# Patient Record
Sex: Male | Born: 1990
Health system: Southern US, Community
[De-identification: ages and names within clinical notes are randomized; demographics above are authoritative.]

## PROBLEM LIST (undated history)

## (undated) DIAGNOSIS — F1490 Cocaine use, unspecified, uncomplicated: Secondary | ICD-10-CM

---
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
## 2017-07-11 ENCOUNTER — Emergency Department (HOSPITAL_BASED_OUTPATIENT_CLINIC_OR_DEPARTMENT_OTHER): Payer: No Typology Code available for payment source

## 2017-07-11 ENCOUNTER — Emergency Department (HOSPITAL_BASED_OUTPATIENT_CLINIC_OR_DEPARTMENT_OTHER)
Admission: EM | Admit: 2017-07-11 | Discharge: 2017-07-11 | Disposition: A | Discharge status: Home / Self Care | Payer: No Typology Code available for payment source | Attending: Emergency Medicine | Admitting: Emergency Medicine

## 2017-07-11 ENCOUNTER — Encounter (HOSPITAL_BASED_OUTPATIENT_CLINIC_OR_DEPARTMENT_OTHER): Payer: Self-pay | Admitting: Emergency Medicine

## 2017-07-11 ENCOUNTER — Other Ambulatory Visit: Payer: Self-pay

## 2017-07-11 DIAGNOSIS — K0381 Cracked tooth: Secondary | ICD-10-CM | POA: Diagnosis not present

## 2017-07-11 DIAGNOSIS — T148XXA Other injury of unspecified body region, initial encounter: Secondary | ICD-10-CM | POA: Insufficient documentation

## 2017-07-11 DIAGNOSIS — Y92410 Unspecified street and highway as the place of occurrence of the external cause: Secondary | ICD-10-CM | POA: Insufficient documentation

## 2017-07-11 DIAGNOSIS — R51 Headache: Secondary | ICD-10-CM | POA: Insufficient documentation

## 2017-07-11 DIAGNOSIS — Y998 Other external cause status: Secondary | ICD-10-CM | POA: Diagnosis not present

## 2017-07-11 DIAGNOSIS — F17200 Nicotine dependence, unspecified, uncomplicated: Secondary | ICD-10-CM | POA: Diagnosis not present

## 2017-07-11 DIAGNOSIS — S3992XA Unspecified injury of lower back, initial encounter: Secondary | ICD-10-CM | POA: Diagnosis present

## 2017-07-11 DIAGNOSIS — S29012A Strain of muscle and tendon of back wall of thorax, initial encounter: Secondary | ICD-10-CM | POA: Diagnosis not present

## 2017-07-11 DIAGNOSIS — Y9389 Activity, other specified: Secondary | ICD-10-CM | POA: Diagnosis not present

## 2017-07-11 LAB — URINALYSIS, ROUTINE W REFLEX MICROSCOPIC
Bilirubin Urine: NEGATIVE
GLUCOSE, UA: NEGATIVE mg/dL
Hgb urine dipstick: NEGATIVE
Ketones, ur: NEGATIVE mg/dL
LEUKOCYTES UA: NEGATIVE
Nitrite: NEGATIVE
PROTEIN: NEGATIVE mg/dL
SPECIFIC GRAVITY, URINE: 1.015 (ref 1.005–1.030)
pH: 6.5 (ref 5.0–8.0)

## 2017-07-11 MED ORDER — HYDROCODONE-ACETAMINOPHEN 5-325 MG PO TABS
2.0000 | ORAL_TABLET | Freq: Once | ORAL | Status: AC
  Administered 2017-07-11: 2 via ORAL
  Filled 2017-07-11: qty 2

## 2017-07-11 MED ORDER — CYCLOBENZAPRINE HCL 10 MG PO TABS: 10.0000 mg | ORAL_TABLET | Freq: Two times a day (BID) | ORAL | 0 refills | Status: DC | PRN

## 2017-07-11 MED FILL — CYCLOBENZAPRINE 10 MG TABLE: 10 | 10 days supply | Qty: 20 | Fill #0
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
## 2017-07-11 NOTE — ED Notes (Signed)
Asking how he is supposed to get his medication "Do I have to pay for them" Patient asking about the muscle relaxant and getting it filled elsewhere. The patient made aware that he needs to discuss this with the pharmacy

## 2017-07-11 NOTE — ED Notes (Signed)
Patient ambulating to the restroom and back without any noted distress. He states that the pain is not any better " I can walk better and the Headache is better but my back still hurts the same" Patient then standing at the bedside showing where his pain is and moving without grimacing. Is smiling and getting into bed without any noted grimacing or distress . U/A collected and sent to the Lab

## 2017-07-11 NOTE — ED Notes (Signed)
Patient is pacing around in the room, and talking on phone. Awaiting for the patient to complete his phone call prior to doing the d/c instructions
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
## 2017-07-11 NOTE — ED Notes (Signed)
Went into give patient pain medications as requested. The patient is on the phone with a lawyer. Took multiple minutes for patient to stop talking on phone and take pain medications.

## 2017-07-11 NOTE — ED Notes (Signed)
Patient transported to X-ray and CT 

## 2017-11-25 ENCOUNTER — Other Ambulatory Visit: Payer: Self-pay

## 2017-11-25 ENCOUNTER — Encounter (HOSPITAL_BASED_OUTPATIENT_CLINIC_OR_DEPARTMENT_OTHER): Payer: Self-pay | Admitting: Emergency Medicine

## 2017-11-25 ENCOUNTER — Emergency Department (HOSPITAL_BASED_OUTPATIENT_CLINIC_OR_DEPARTMENT_OTHER)
Admission: EM | Admit: 2017-11-25 | Discharge: 2017-11-25 | Disposition: A | Discharge status: Home / Self Care | Payer: Self-pay | Attending: Emergency Medicine | Admitting: Emergency Medicine

## 2017-11-25 DIAGNOSIS — K047 Periapical abscess without sinus: Secondary | ICD-10-CM | POA: Insufficient documentation

## 2017-11-25 DIAGNOSIS — R519 Headache, unspecified: Secondary | ICD-10-CM

## 2017-11-25 DIAGNOSIS — R51 Headache: Secondary | ICD-10-CM | POA: Insufficient documentation

## 2017-11-25 DIAGNOSIS — F17210 Nicotine dependence, cigarettes, uncomplicated: Secondary | ICD-10-CM | POA: Insufficient documentation

## 2017-11-25 MED ORDER — AMOXICILLIN 500 MG PO CAPS
500.0000 mg | ORAL_CAPSULE | Freq: Once | ORAL | Status: AC
  Administered 2017-11-25: 500 mg via ORAL
  Filled 2017-11-25: qty 1

## 2017-11-25 MED ORDER — AMOXICILLIN 500 MG PO CAPS: 500.0000 mg | ORAL_CAPSULE | Freq: Three times a day (TID) | ORAL | 0 refills | Status: DC

## 2017-11-25 MED ORDER — TRAMADOL HCL 50 MG PO TABS
50.0000 mg | ORAL_TABLET | Freq: Once | ORAL | Status: AC
  Administered 2017-11-25: 50 mg via ORAL
  Filled 2017-11-25: qty 1

## 2017-11-25 MED ORDER — TRAMADOL HCL 50 MG PO TABS: 50.0000 mg | ORAL_TABLET | Freq: Four times a day (QID) | ORAL | 0 refills | Status: DC | PRN

## 2017-11-25 NOTE — Discharge Instructions (Signed)
It was our pleasure to provide your ER care today - we hope that you feel better.  Take amoxicillin (antibiotic) as prescribed.  Take acetaminophen as need for pain. You may also take ultram as need for pain - no driving when taking.  Follow up with dentist in the coming week - call office Monday to arrange appointment.  Return to ER if worse, other concern.

## 2017-11-25 NOTE — ED Triage Notes (Signed)
Patient reports that he was here last night for his daughter. The patient states that he is having pain to his right ear and tooth

## 2017-11-25 NOTE — ED Provider Notes (Signed)
[facility] EMERGENCY DEPARTMENT Provider Note   CSN: [id number] Arrival date & time: 11/25/17  1636     History   Chief Complaint Chief Complaint  Patient presents with  . Otalgia    HPI James Moody is a 27 y.o. male.  Patient c/o right upper tooth pain, and now shooting pain towards right ear and cheek. States previously had a cracked tooth in area. Denies acute/new injury to area. No fever or chills. No sore throat. No trouble breathing or swallowing. No change in hearing. No headache. No neck swelling.pain.   The history is provided by the patient.  Otalgia  Pertinent negatives include no headaches, no sore throat and no vomiting.    History reviewed. No pertinent past medical history.  There are no active problems to display for this patient.   History reviewed. No pertinent surgical history.      Home Medications    Prior to Admission medications   Medication Sig Start Date End Date Taking? Authorizing Provider  cyclobenzaprine (FLEXERIL) 10 MG tablet Take 1 tablet (10 mg total) by mouth 2 (two) times daily as needed for muscle spasms. 07/11/17   Maxwell Caul, PA-C    Family History History reviewed. No pertinent family history.  Social History Social History   Tobacco Use  . Smoking status: Current Every Day Smoker  . Smokeless tobacco: Never Used  Substance Use Topics  . Alcohol use: No    Frequency: Never  . Drug use: Yes    Types: Marijuana     Allergies   Motrin [ibuprofen]   Review of Systems Review of Systems  Constitutional: Negative for fever.  HENT: Positive for dental problem and ear pain. Negative for sore throat and trouble swallowing.   Respiratory: Negative for shortness of breath.   Gastrointestinal: Negative for vomiting.  Neurological: Negative for headaches.     Physical Exam Updated Vital Signs BP (!) 157/74 (BP Location: Left Arm)   Pulse 97   Temp 98.2 F (36.8 C) (Oral)   Resp 18   Ht 1.803  m (5\' 11" )   Wt 106.6 kg (235 lb)   SpO2 100%   BMI 32.78 kg/m   Physical Exam  Constitutional: He appears well-developed and well-nourished. No distress.  HENT:  Mouth/Throat: Oropharynx is clear and moist.  Right eac and tm normal. No mastoid tenderness. Minimal swelling right cheek. Right upper dental decay/partially broken tooth noted, mild gum swelling to posterior molar. No swelling to pharynx, floor of mouth or neck.   Eyes: Conjunctivae are normal.  Neck: Neck supple. No tracheal deviation present.  Cardiovascular: Normal rate.  Pulmonary/Chest: Effort normal. No accessory muscle usage. No respiratory distress.  Lymphadenopathy:    He has no cervical adenopathy.  Neurological: He is alert.  Skin: Skin is warm and dry. No rash noted. He is not diaphoretic.  Psychiatric: He has a normal mood and affect.  Nursing note and vitals reviewed.    ED Treatments / Results  Labs (all labs ordered are listed, but only abnormal results are displayed) Labs Reviewed - No data to display  EKG None  Radiology No results found.  Procedures Procedures (including critical care time)  Medications Ordered in ED Medications  amoxicillin (AMOXIL) capsule 500 mg (has no administration in time range)  traMADol (ULTRAM) tablet 50 mg (has no administration in time range)     Initial Impression / Assessment and Plan / ED Course  I have reviewed the triage vital signs and the  nursing notes.  Pertinent labs & imaging results that were available during my care of the patient were reviewed by me and considered in my medical decision making (see chart for details).  Confirmed nkda, and w motrin has gi upset.   No meds pta. Pt has ride.   Ultram po.  Amoxicillin po.  rec close outpt dental f/u.     Final Clinical Impressions(s) / ED Diagnoses   Final diagnoses:  None    ED Discharge Orders    None       Cathren LaineSteinl, Dezirae Service, MD 11/25/17 1736

## 2018-11-21 ENCOUNTER — Emergency Department (HOSPITAL_BASED_OUTPATIENT_CLINIC_OR_DEPARTMENT_OTHER)
Admission: EM | Admit: 2018-11-21 | Discharge: 2018-11-21 | Disposition: A | Discharge status: Home / Self Care | Payer: Self-pay | Attending: Emergency Medicine | Admitting: Emergency Medicine

## 2018-11-21 ENCOUNTER — Encounter (HOSPITAL_BASED_OUTPATIENT_CLINIC_OR_DEPARTMENT_OTHER): Payer: Self-pay | Admitting: Emergency Medicine

## 2018-11-21 ENCOUNTER — Other Ambulatory Visit: Payer: Self-pay

## 2018-11-21 DIAGNOSIS — T754XXA Electrocution, initial encounter: Secondary | ICD-10-CM | POA: Insufficient documentation

## 2018-11-21 DIAGNOSIS — F17200 Nicotine dependence, unspecified, uncomplicated: Secondary | ICD-10-CM | POA: Insufficient documentation

## 2018-11-21 DIAGNOSIS — F1410 Cocaine abuse, uncomplicated: Secondary | ICD-10-CM | POA: Insufficient documentation

## 2018-11-21 HISTORY — DX: Cocaine use, unspecified, uncomplicated: F14.90

## 2018-11-21 MED ORDER — LORAZEPAM 1 MG PO TABS
1.0000 mg | ORAL_TABLET | Freq: Once | ORAL | Status: AC
  Administered 2018-11-21: 1 mg via ORAL
  Filled 2018-11-21: qty 1

## 2018-11-21 NOTE — ED Notes (Signed)
Pt given water per request
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
## 2018-11-21 NOTE — ED Notes (Signed)
Pt admits to using cocaine 1 hour PTA.
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
## 2019-03-24 ENCOUNTER — Emergency Department (HOSPITAL_BASED_OUTPATIENT_CLINIC_OR_DEPARTMENT_OTHER): Payer: Self-pay

## 2019-03-24 ENCOUNTER — Encounter (HOSPITAL_BASED_OUTPATIENT_CLINIC_OR_DEPARTMENT_OTHER): Payer: Self-pay | Admitting: Emergency Medicine

## 2019-03-24 ENCOUNTER — Other Ambulatory Visit: Payer: Self-pay

## 2019-03-24 ENCOUNTER — Emergency Department (HOSPITAL_BASED_OUTPATIENT_CLINIC_OR_DEPARTMENT_OTHER)
Admission: EM | Admit: 2019-03-24 | Discharge: 2019-03-24 | Disposition: A | Discharge status: Home / Self Care | Payer: Self-pay | Attending: Emergency Medicine | Admitting: Emergency Medicine

## 2019-03-24 DIAGNOSIS — R0789 Other chest pain: Secondary | ICD-10-CM | POA: Insufficient documentation

## 2019-03-24 DIAGNOSIS — F17200 Nicotine dependence, unspecified, uncomplicated: Secondary | ICD-10-CM | POA: Insufficient documentation

## 2019-03-24 MED ORDER — LABETALOL HCL 5 MG/ML IV SOLN
20.0000 mg | Freq: Once | INTRAVENOUS | Status: AC
  Administered 2019-03-24: 01:00:00 20 mg via INTRAVENOUS

## 2019-03-24 MED ORDER — LABETALOL HCL 5 MG/ML IV SOLN
INTRAVENOUS | Status: AC
  Filled 2019-03-24: qty 4

## 2019-03-24 MED ORDER — SUCRALFATE 1 GM/10ML PO SUSP
1.0000 g | Freq: Once | ORAL | Status: AC
  Administered 2019-03-24: 02:00:00 1 g via ORAL
  Filled 2019-03-24: qty 10

## 2019-03-24 MED ORDER — PANTOPRAZOLE SODIUM 40 MG IV SOLR
40.0000 mg | Freq: Once | INTRAVENOUS | Status: AC
  Administered 2019-03-24: 40 mg via INTRAVENOUS
  Filled 2019-03-24: qty 40

## 2019-03-24 MED ORDER — ACETAMINOPHEN 500 MG PO TABS
1000.0000 mg | ORAL_TABLET | Freq: Once | ORAL | Status: AC
  Administered 2019-03-24: 01:00:00 1000 mg via ORAL

## 2019-03-24 MED ORDER — ACETAMINOPHEN 500 MG PO TABS
ORAL_TABLET | ORAL | Status: AC
  Filled 2019-03-24: qty 2

## 2019-03-24 NOTE — ED Provider Notes (Signed)
[facility] Provider Note: Georgena Spurling, MD, FACEP  CSN: [id number] MRN: [id number] ARRIVAL: 03/24/19 at [place]: [place]  Chest Pain   HISTORY OF PRESENT ILLNESS  03/24/19 12:46 AM James Moody is a 28 y.o. male who developed the sudden onset of chest pain about 15 minutes prior to arrival while he was lying down.  The pain is well localized and located to the left of his lower sternum.  The pain is worse when leaning forward or moving and better when lying supine and still.  He is equivocal about associated shortness of breath but states he has difficulty taking a deep breath when he is bending forward.  He denies heavy lifting or other inciting activity.  He rates his pain as a 7 out of 10 and describes it as a tightness, burning or dull pain.  He states he last used cocaine 2 days ago (snorted).   Past Medical History:  Diagnosis Date  . Cocaine use     History reviewed. No pertinent surgical history.  No family history on file.  Social History   Tobacco Use  . Smoking status: Current Every Day Smoker  . Smokeless tobacco: Never Used  Substance Use Topics  . Alcohol use: Yes    Frequency: Never  . Drug use: Yes    Types: Marijuana, Cocaine    Comment: snort cocaine last use 2 days ago    Prior to Admission medications   Not on File    Allergies Motrin [ibuprofen]   REVIEW OF SYSTEMS  Negative except as noted here or in the History of Present Illness.   PHYSICAL EXAMINATION  Initial Vital Signs Blood pressure (!) 157/98, pulse (!) 118, temperature 98.1 F (36.7 C), temperature source Oral, resp. rate 20, height 5\' 11"  (1.803 m), weight 106.6 kg, SpO2 100 %.  Examination General: Well-developed, well-nourished male in no acute distress; appearance consistent with age of record HENT: normocephalic; atraumatic Eyes: pupils equal, round and reactive to light; extraocular muscles intact Neck: supple Heart: regular rate  and rhythm; tachycardia Lungs: clear to auscultation bilaterally Chest: Nontender but pain reproduced with movement of chest Abdomen: soft; nondistended; nontender; bowel sounds present Extremities: No deformity; full range of motion; pulses normal Neurologic: Awake, alert and oriented; motor function intact in all extremities and symmetric; no facial droop Skin: Warm and dry Psychiatric: Flat affect; expresses fear of being electrocuted by his EKG leads   RESULTS  Summary of this visit's results, reviewed by myself:   EKG Interpretation  Date/Time:  Sunday March 24 2019 00:40:33 EDT Ventricular Rate:  115 PR Interval:    QRS Duration: 94 QT Interval:  347 QTC Calculation: 480 R Axis:   68 Text Interpretation:  Sinus tachycardia Abnormal T, consider ischemia, diffuse leads Baseline wander in lead(s) V4 No significant change was found Confirmed by Shanon Rosser [phone]) on 03/24/2019 12:51:27 AM       EKG Interpretation  Date/Time:  Sunday March 24 2019 02:47:48 EDT Ventricular Rate:  89 PR Interval:    QRS Duration: 105 QT Interval:  385 QTC Calculation: 469 R Axis:   67 Text Interpretation:  Sinus rhythm Nonspecific T abnormalities, diffuse leads Borderline prolonged QT interval Rate is slower Confirmed by Shanon Rosser [phone]) on 03/24/2019 2:49:59 AM      Laboratory Studies: No results found for this or any previous visit (from the past 24 hour(s)). Imaging Studies: Dg Chest 2 View  Result Date: 03/24/2019 CLINICAL DATA:  Localize left lower chest pain. Acute onset of shortness of breath. EXAM: CHEST - 2 VIEW COMPARISON:  07/11/2017 FINDINGS: The cardiomediastinal contours are normal. The lungs are clear. Pulmonary vasculature is normal. No consolidation, pleural effusion, or pneumothorax. No acute osseous abnormalities are seen. IMPRESSION: Unremarkable radiographs of the chest. Electronically Signed   By: Narda RutherfordMelanie  Sanford M.D.   On: 03/24/2019 01:23    ED COURSE and  MDM  Nursing notes and initial vitals signs, including pulse oximetry, reviewed.  Vitals:   03/24/19 0113 03/24/19 0137 03/24/19 0138 03/24/19 0200  BP:  (!) 162/104  (!) 153/84  Pulse: (!) 123  94 99  Resp: 17   (!) 25  Temp:      TempSrc:      SpO2: 100%  100% 100%  Weight:      Height:       2:45 AM Tachycardia improved with labetalol given for suspected cocaine use.  Patient still complaining of burning in his chest.  He was given acetaminophen, IV Protonix and oral Carafate.  Toradol avoided due to his documented ibuprofen allergy.   2:58 AM Patient pain-free now.  The nature of his pain is not consistent with acute coronary syndrome.  It is more likely chest wall pain.  He has no risk factors for pulmonary embolism.   PROCEDURES    ED DIAGNOSES     ICD-10-CM   1. Chest wall pain  R07.89        Paula LibraMolpus, Bell Cai, MD 03/24/19 0300

## 2019-03-24 NOTE — ED Notes (Signed)
Provided warm blanket. HR improved however continues to be hypertensive and c/o pain. Reports no relief from pain, continues to complain of burning. States "I feel hot from the inside out." EDP aware of patient's complaints.
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
## 2019-03-24 NOTE — ED Triage Notes (Signed)
Pt c/o of left sided chest tightness and shortness of breath that started approx 15 mins pta. States he was just laying down when it started.
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
## 2019-03-24 NOTE — ED Notes (Signed)
Pt reports no pain at this time. EDP notified.

## 2020-11-30 IMAGING — CR CHEST - 2 VIEW
2 series · 2 of 2 positions shown · non-contrast
Comparison: 07/11/2017

CLINICAL DATA: Localize left lower chest pain. Acute onset of
shortness of breath.

EXAM:
CHEST - 2 VIEW

[w chest pa]
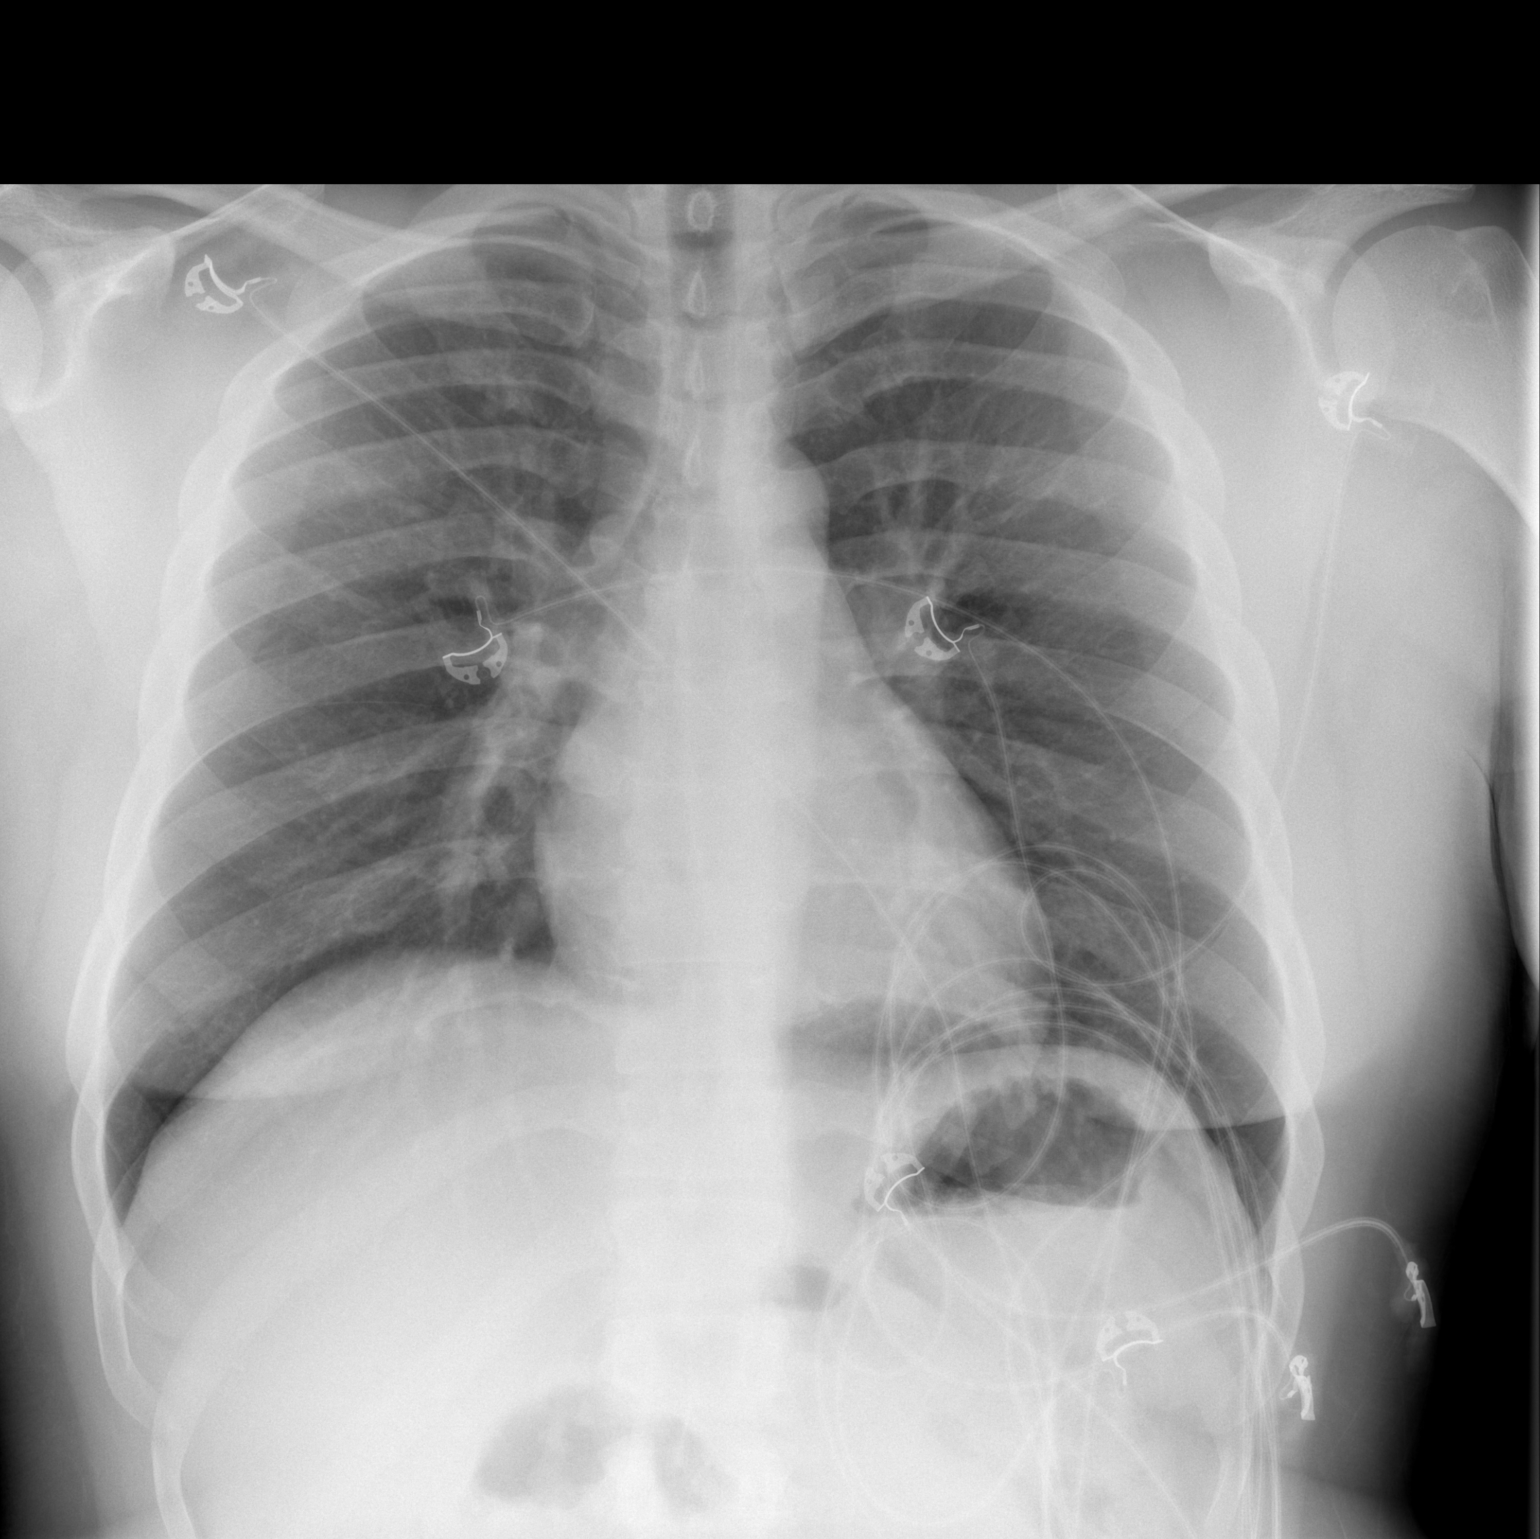

[w chest lat]
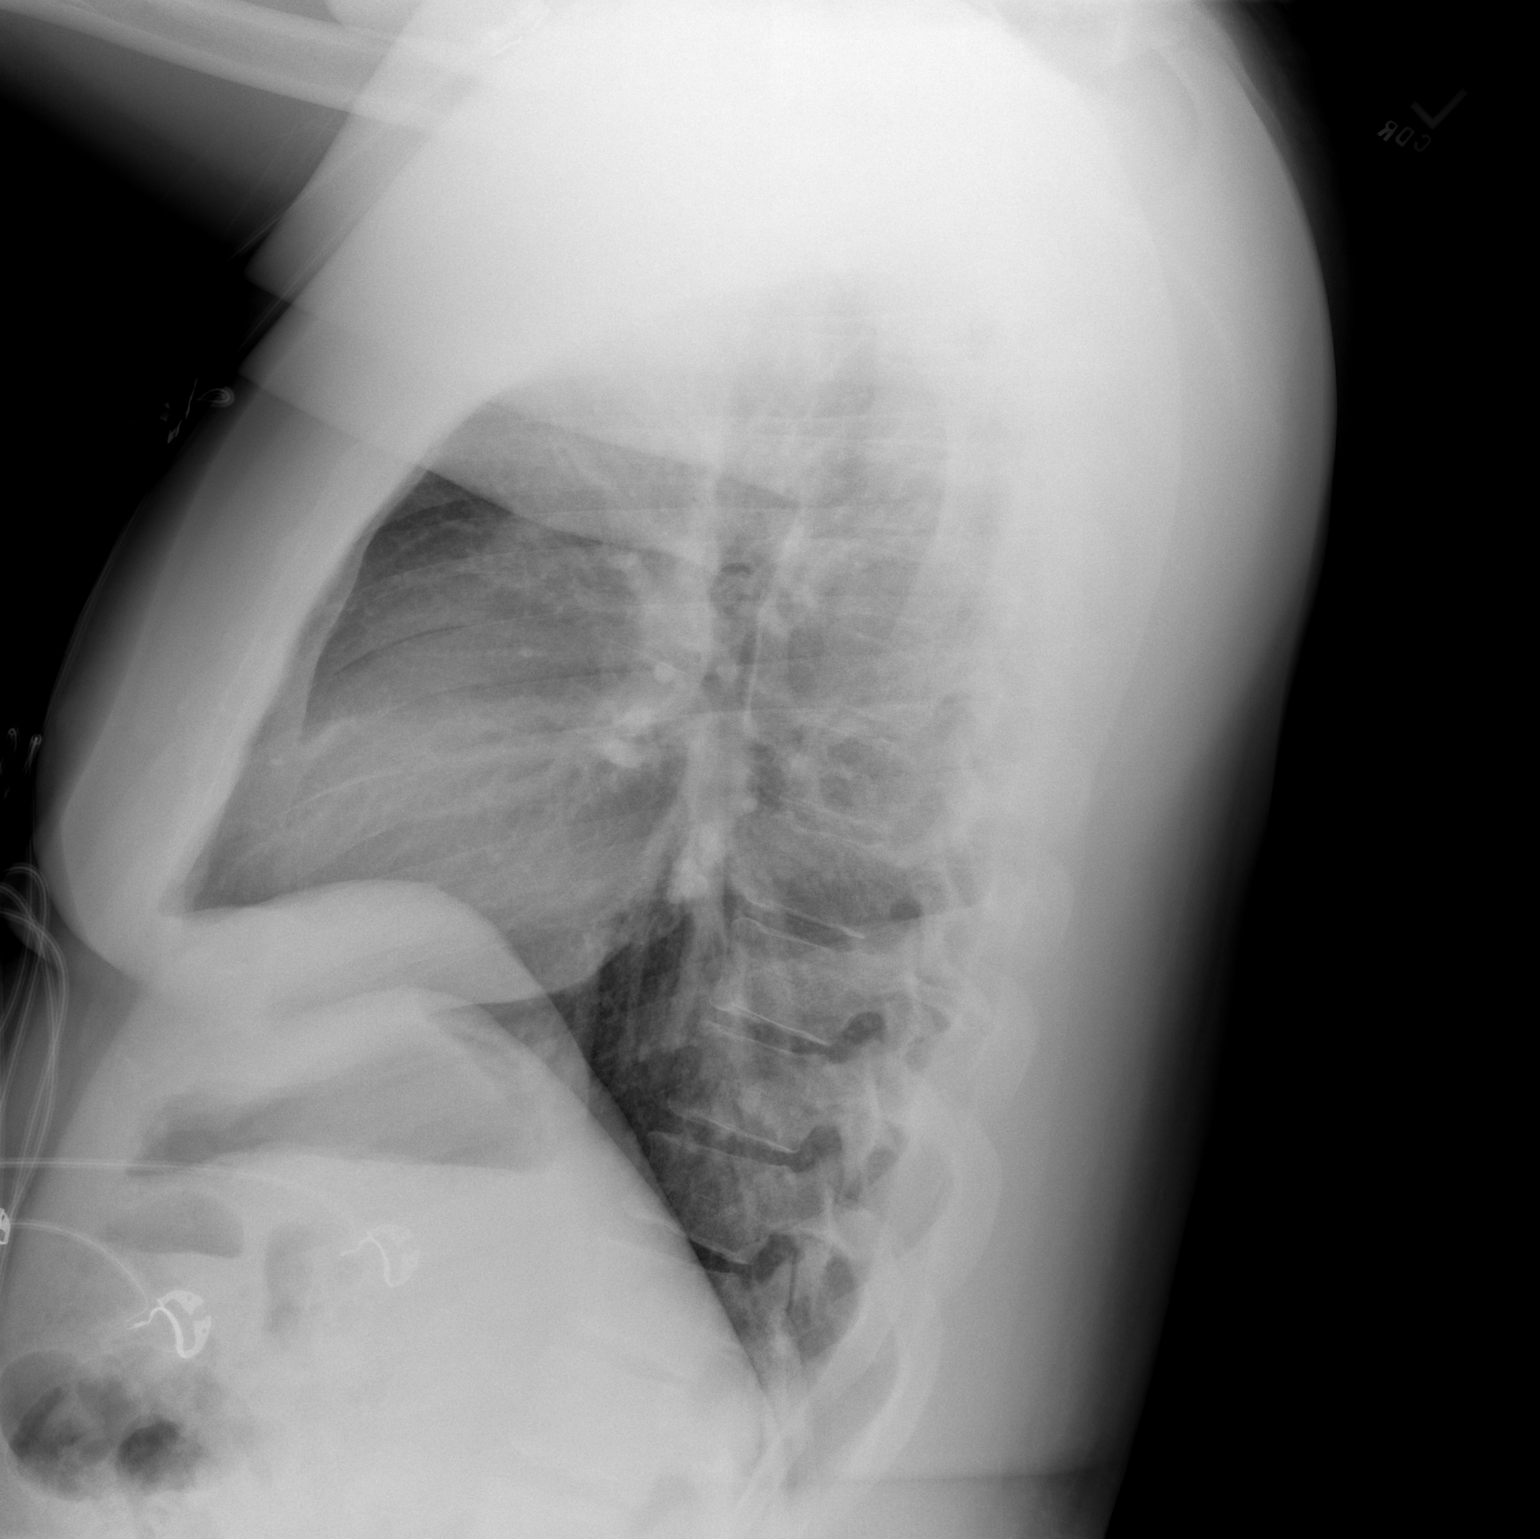

[2 of 2 positions shown; findings below may reference images not displayed]

FINDINGS: The cardiomediastinal contours are normal. The lungs are clear.
Pulmonary vasculature is normal. No consolidation, pleural effusion,
or pneumothorax. No acute osseous abnormalities are seen.
IMPRESSION: Unremarkable radiographs of the chest.
